# Patient Record
Sex: Female | Born: 1965 | Hispanic: Yes | Marital: Single | Smoking: Never smoker
Health system: Southern US, Community
[De-identification: ages and names within clinical notes are randomized; demographics above are authoritative.]

---

## 2022-03-12 ENCOUNTER — Ambulatory Visit (INDEPENDENT_AMBULATORY_CARE_PROVIDER_SITE_OTHER): Payer: BC Managed Care – PPO

## 2022-03-12 ENCOUNTER — Ambulatory Visit
Admission: RE | Admit: 2022-03-12 | Discharge: 2022-03-12 | Disposition: A | Discharge status: Home / Self Care | Payer: BC Managed Care – PPO | Source: Ambulatory Visit

## 2022-03-12 VITALS — BP 140/91 | HR 64 | Temp 98.4°F | Resp 18

## 2022-03-12 DIAGNOSIS — R059 Cough, unspecified: Secondary | ICD-10-CM

## 2022-03-12 DIAGNOSIS — R051 Acute cough: Secondary | ICD-10-CM

## 2022-03-12 DIAGNOSIS — R509 Fever, unspecified: Secondary | ICD-10-CM | POA: Diagnosis not present

## 2022-03-12 MED ORDER — BENZONATATE 100 MG PO CAPS: 100.0000 mg | ORAL_CAPSULE | Freq: Three times a day (TID) | ORAL | 0 refills | Status: AC

## 2022-03-12 MED ORDER — PROMETHAZINE-DM 6.25-15 MG/5ML PO SYRP: 5.0000 mL | ORAL_SOLUTION | Freq: Four times a day (QID) | ORAL | 0 refills | Status: AC | PRN

## 2022-03-12 NOTE — Discharge Instructions (Addendum)
Your chest x-ray was negative for signs of infection, fluid or structural changes ? ?Your cough is most likely a result of seasonal weather changes versus a viral process and will steadily get better with time ? ?The blood that you are seeing is a result of dryness to your upper airways, you may moisten your airways by steaming up your bathroom in sitting for 10 to 15 minutes 1-2 times daily ? ?If you have a humidifier you may use at bedtime ? ?May use Tessalon pill every 8 hours as needed to help calm your coughing ? ?You may use cough syrup every 6 hours as needed for additional comfort, be mindful this medication may make you drowsy, this occurs use before bed ? ?May follow-up with urgent care if coughing persists greater than 3 weeks ? ? ?sang rainbhaat dharat mhaan ritehkyinnsai  rawgar poewainhkyinn , aarai shoetmahote  hpwalhcaeeponepyaungglellmhumyarreat  lakhkanarmyarraatwat  aanote lakhkanar saung parsai . ? ? sang hkyaunggsoehkyinnsai rarseaalite  rarseutu pyaungglellmhukyawwng  hpyitninehkyaymyarrsaw  binerautit hpyithcaintaithkuhpyitpyee  aahkyane kyaar larsai nhangaamyaha  tahpyihpyi saatsar lar maihpyitsai . ? ? sain myin nayrasaw swaymyarrsai  sang aapaw pine lay pyawan hkyawwatswae hkyinn kyawwnghpyitpyee  sang rayhkyoehkaannhtelltwin  1 0 minaitmha  1 5  minaithkan naehcain  1- 2  kyaain htinekar  sang laylamkyaunggko  hco hcwat hcay nineparsai . ? ? sangtwin  a hco dharat htein hcaatshipark  aiutrarwainhkyanetwin  aasonepyunineparsai . ? ? hkyaunggsoehkyinnko  saatsarhcayraan Tessalon  sayyko loaautsalo  8  narretine  sone ninesai . ? ? htautlaungg saatsarhcayraan loaautsalo  hkyaunggsoe pyawwatsayy raiko loaautsalo 6  narretaitkyaain  aasonepyunineparsai ,  i sayysai  sangaarr  ngite myain hcayninekyaungg  satihtarrpar ,  aiutrar mawainme  aasonepyu park  hpyit taat parsai . ? ?3  paat htaatpo  hkyaunggsoe nay park  aarayypaw hcawng shout mhuhpyang  noutsaattwal lotesaung nineparsai . ? ?

## 2022-03-12 NOTE — ED Triage Notes (Signed)
[employer] interpreter Sabae #[id number].   ? ?Pt presents with cough x 1 week with specks of blood and possible fever at night.  No night sweats.  Throat hurts but only when coughing. +nasal congestion.  ? ?Pt states she has had blood with coughing for a couple years, is scared she has CA.  Pt is from [place], tested negative for TB approx 12-13 yrs ago.   ?

## 2022-03-12 NOTE — ED Provider Notes (Signed)
?[facility] ? ? ? ?CSN: [id number] ?Arrival date & time: 03/12/22  1102 ? ? ?  ? ?History   ?Chief Complaint ?Chief Complaint  ?Patient presents with  ? Cough  ?  Been sick for a week and small amounts blood come when coughing - Entered by patient  ? ? ?HPI ?Yumi Eley is a 56 y.o. female.  ? ?Patient presents with productive cough with small amount of blood in sputum intermittently for 7 days.  Associated nasal congestion and subjective fever. symptoms are worse at nighttime.  Has attempted use of Tylenol for fever management which has been effective.  No known sick contacts.  Tolerating food and liquids.  Denies chest pain or tightness, shortness of breath, wheezing, chills, body aches, rhinorrhea, sore throat.   ? ?[place] interpreter used for entirety of exam ? ?History reviewed. No pertinent past medical history. ? ?There are no problems to display for this patient. ? ? ?History reviewed. No pertinent surgical history. ? ?OB History   ?No obstetric history on file. ?  ? ? ? ?Home Medications   ? ?Prior to Admission medications   ?Medication Sig Start Date End Date Taking? Authorizing Provider  ?acetaminophen (TYLENOL) 325 MG tablet Take 650 mg by mouth every 6 (six) hours as needed.   Yes [provider]  ? ? ?Family History ?No family history on file. ? ?Social History ?Social History  ? ?Tobacco Use  ? Smoking status: Never  ? Smokeless tobacco: Never  ?Vaping Use  ? Vaping Use: Never used  ? ? ? ?Allergies   ?Patient has no known allergies. ? ? ?Review of Systems ?Review of Systems  ?Constitutional: Negative.   ?HENT:  Positive for congestion. Negative for dental problem, drooling, ear discharge, ear pain, facial swelling, hearing loss, mouth sores, nosebleeds, postnasal drip, rhinorrhea, sinus pressure, sinus pain, sneezing, sore throat, tinnitus, trouble swallowing and voice change.   ?Respiratory:  Positive for cough. Negative for apnea, choking, chest tightness, shortness of breath,  wheezing and stridor.   ?Cardiovascular: Negative.   ?Gastrointestinal: Negative.   ?Skin: Negative.   ?Neurological: Negative.   ? ? ?Physical Exam ?Triage Vital Signs ?ED Triage Vitals  ?Enc Vitals Group  ?   BP 03/12/22 1133 (!) 140/91  ?   Pulse Rate 03/12/22 1133 64  ?   Resp 03/12/22 1133 18  ?   Temp 03/12/22 1133 98.4 ?F (36.9 ?C)  ?   Temp Source 03/12/22 1133 Oral  ?   SpO2 03/12/22 1133 96 %  ?   Weight --   ?   Height --   ?   Head Circumference --   ?   Peak Flow --   ?   Pain Score 03/12/22 1125 7  ?   Pain Loc --   ?   Pain Edu? --   ?   Excl. in GC? --   ? ?No data found. ? ?Updated Vital Signs ?BP (!) 140/91 (BP Location: Left Arm)   Pulse 64   Temp 98.4 ?F (36.9 ?C) (Oral)   Resp 18   SpO2 96%  ? ?Visual Acuity ?Right Eye Distance:   ?Left Eye Distance:   ?Bilateral Distance:   ? ?Right Eye Near:   ?Left Eye Near:    ?Bilateral Near:    ? ?Physical Exam ?Constitutional:   ?   Appearance: Normal appearance.  ?HENT:  ?   Right Ear: Tympanic membrane, ear canal and external ear normal.  ?  Left Ear: Tympanic membrane, ear canal and external ear normal.  ?   Nose: Nose normal.  ?   Mouth/Throat:  ?   Mouth: Mucous membranes are moist.  ?   Pharynx: Oropharynx is clear.  ?Eyes:  ?   Extraocular Movements: Extraocular movements intact.  ?Cardiovascular:  ?   Rate and Rhythm: Normal rate and regular rhythm.  ?   Pulses: Normal pulses.  ?   Heart sounds: Normal heart sounds.  ?Pulmonary:  ?   Effort: Pulmonary effort is normal.  ?   Breath sounds: Normal breath sounds.  ?   Comments: Dry cough witnessed in exam ?Skin: ?   General: Skin is warm and dry.  ?Neurological:  ?   Mental Status: She is alert and oriented to person, place, and time. Mental status is at baseline.  ?Psychiatric:     ?   Mood and Affect: Mood normal.     ?   Behavior: Behavior normal.  ? ? ? ?UC Treatments / Results  ?Labs ?(all labs ordered are listed, but only abnormal results are displayed) ?Labs Reviewed - No data to  display ? ?EKG ? ? ?Radiology ?No results found. ? ?Procedures ?Procedures (including critical care time) ? ?Medications Ordered in UC ?Medications - No data to display ? ?Initial Impression / Assessment and Plan / UC Course  ?I have reviewed the triage vital signs and the nursing notes. ? ?Pertinent labs & imaging results that were available during my care of the patient were reviewed by me and considered in my medical decision making (see chart for details). ? ?Acute cough ? ?Vital signs are stable, O2 saturation 96% on room air, lungs clear to auscultation, chest x-ray negative, discussed all findings with patient, etiology is symptoms are most likely viral versus seasonal weather changes, discussed with patient, because of what is most likely dryness to the upper airways, recommended use of humidifier or steam bathroom for 10 to 15 minutes 1-2 times a day to help moisten airways and prevent blood production, Tessalon and Promethazine DM prescribed for management of coughing May follow-up with urgent care as needed for cough persisting to 3 weeks or signs of difficulty breathing, verbalized understanding ?Final Clinical Impressions(s) / UC Diagnoses  ? ?Final diagnoses:  ?None  ? ?Discharge Instructions   ?None ?  ? ?ED Prescriptions   ?None ?  ? ?PDMP not reviewed this encounter. ?  ?Valinda Hoar, NP ?03/12/22 1245 ? ?

## 2023-03-30 IMAGING — CR DG CHEST 2V
2 series · 2 of 2 positions shown · non-contrast
Comparison: None

CLINICAL DATA: Cough for 1 week with specks of blood, possible
fever at night, nasal congestion

EXAM:
CHEST - 2 VIEW

[chest pa]
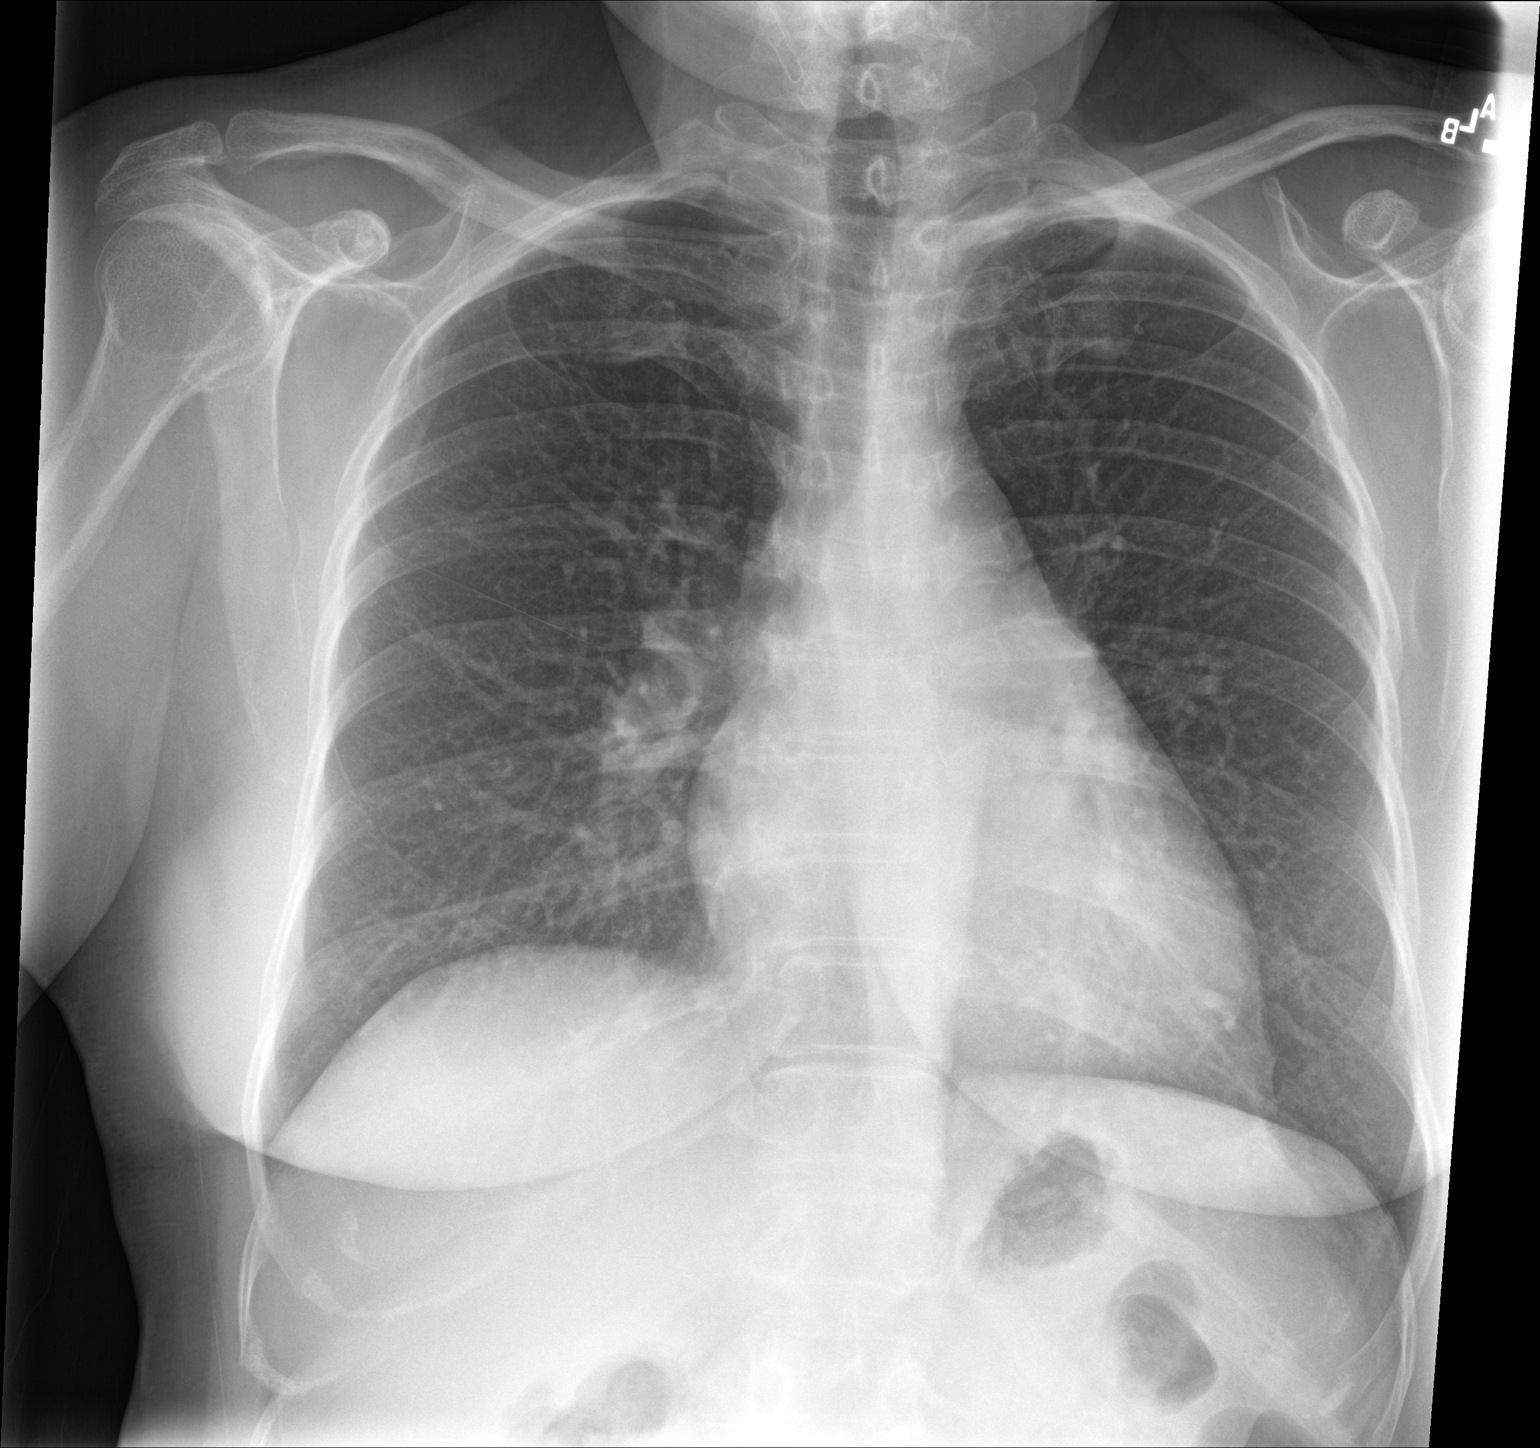

[chest lat]
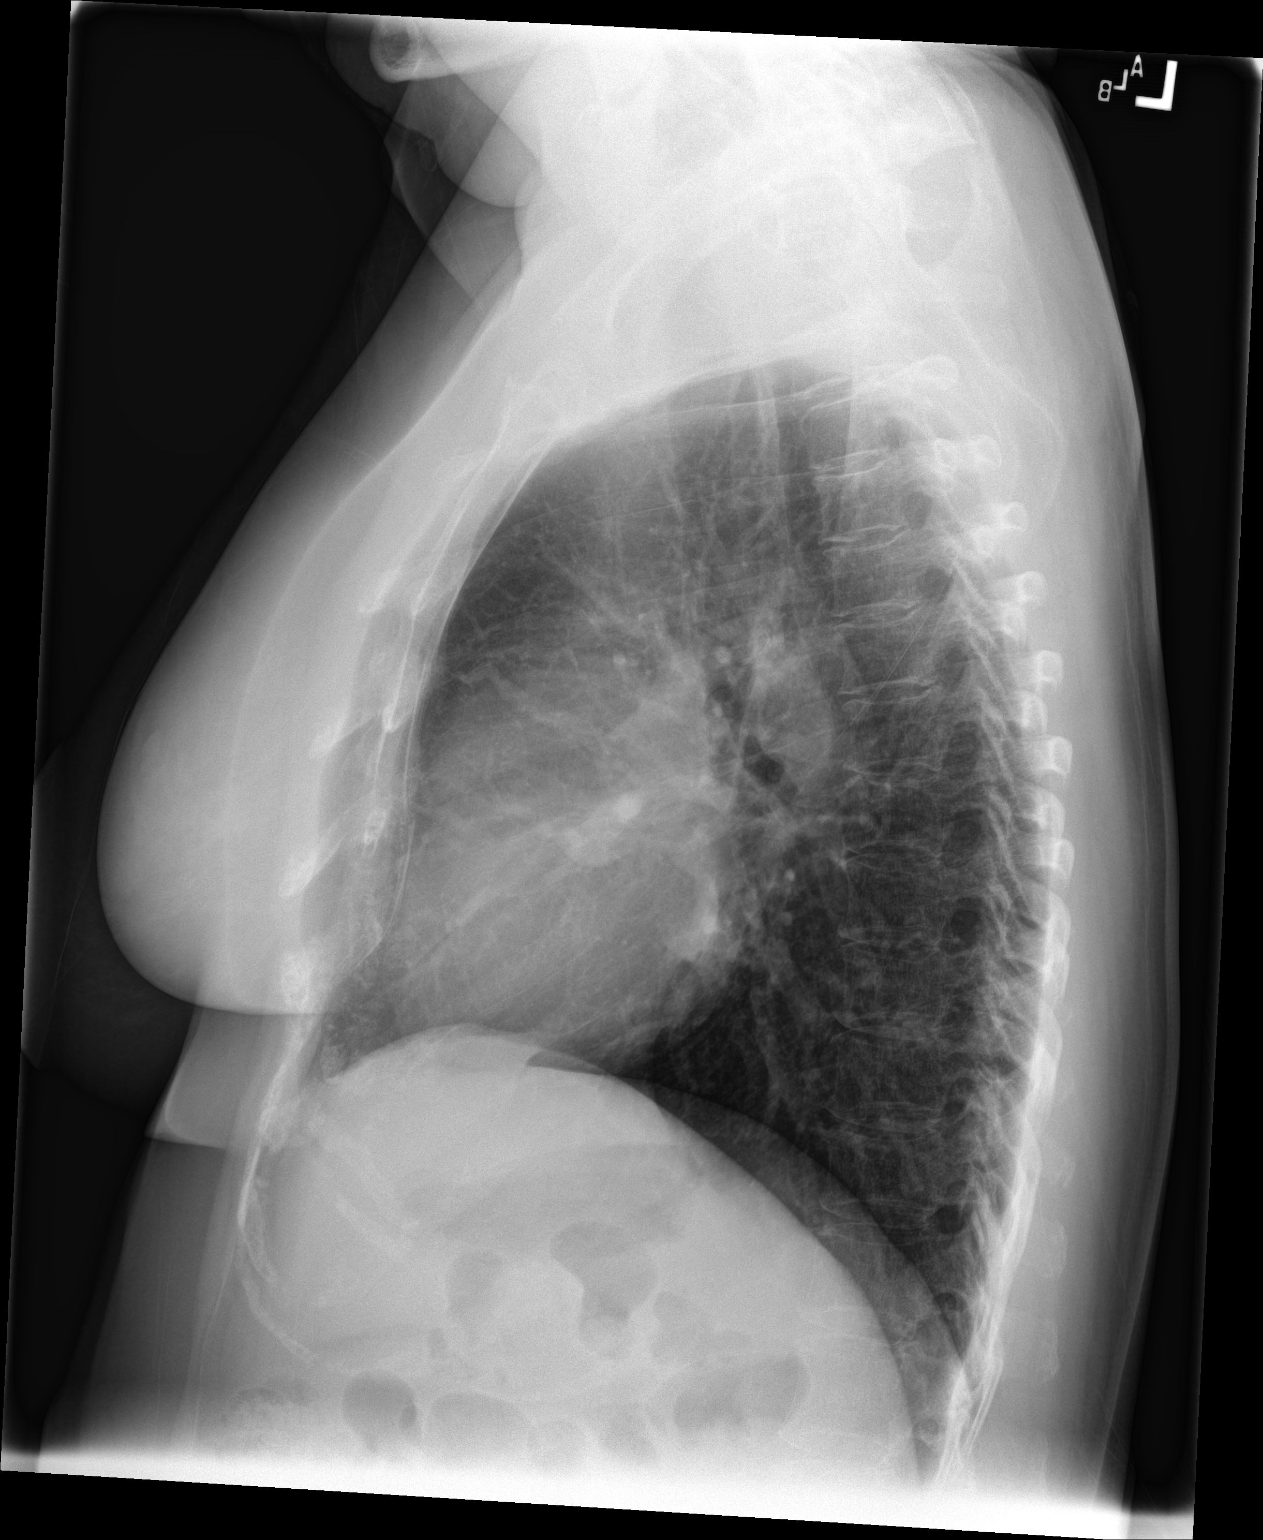

[2 of 2 positions shown; findings below may reference images not displayed]

FINDINGS: Normal heart size, mediastinal contours, and pulmonary vascularity.

Lungs clear.

No pleural effusion or pneumothorax.

Bones unremarkable.
IMPRESSION: Normal exam.
